# Patient Record
Sex: Female | Born: 1965 | Race: Asian | Hispanic: No | Marital: Married | Smoking: Never smoker
Health system: Southern US, Community
[De-identification: ages and names within clinical notes are randomized; demographics above are authoritative.]

## PROBLEM LIST (undated history)

## (undated) HISTORY — PX: NECK SURGERY: SHX720

---

## 2014-10-04 ENCOUNTER — Other Ambulatory Visit: Payer: Self-pay | Admitting: Internal Medicine

## 2014-10-04 DIAGNOSIS — Z1231 Encounter for screening mammogram for malignant neoplasm of breast: Secondary | ICD-10-CM

## 2014-10-13 ENCOUNTER — Ambulatory Visit
Admission: RE | Admit: 2014-10-13 | Discharge: 2014-10-13 | Disposition: A | Discharge status: Home / Self Care | Payer: BLUE CROSS/BLUE SHIELD | Source: Ambulatory Visit | Attending: Internal Medicine | Admitting: Internal Medicine

## 2014-10-13 DIAGNOSIS — Z1231 Encounter for screening mammogram for malignant neoplasm of breast: Secondary | ICD-10-CM

## 2017-07-31 ENCOUNTER — Other Ambulatory Visit: Payer: Self-pay | Admitting: Internal Medicine

## 2017-07-31 DIAGNOSIS — Z1231 Encounter for screening mammogram for malignant neoplasm of breast: Secondary | ICD-10-CM

## 2017-08-28 ENCOUNTER — Ambulatory Visit
Admission: RE | Admit: 2017-08-28 | Discharge: 2017-08-28 | Disposition: A | Discharge status: Home / Self Care | Payer: PRIVATE HEALTH INSURANCE | Source: Ambulatory Visit | Attending: Internal Medicine | Admitting: Internal Medicine

## 2017-08-28 DIAGNOSIS — Z1231 Encounter for screening mammogram for malignant neoplasm of breast: Secondary | ICD-10-CM

## 2017-08-29 ENCOUNTER — Other Ambulatory Visit: Payer: Self-pay | Admitting: Internal Medicine

## 2017-08-29 DIAGNOSIS — R928 Other abnormal and inconclusive findings on diagnostic imaging of breast: Secondary | ICD-10-CM

## 2017-11-11 ENCOUNTER — Ambulatory Visit
Admission: RE | Admit: 2017-11-11 | Discharge: 2017-11-11 | Disposition: A | Discharge status: Home / Self Care | Payer: BLUE CROSS/BLUE SHIELD | Source: Ambulatory Visit | Attending: Internal Medicine | Admitting: Internal Medicine

## 2017-11-11 ENCOUNTER — Ambulatory Visit
Admission: RE | Admit: 2017-11-11 | Discharge: 2017-11-11 | Disposition: A | Discharge status: Home / Self Care | Payer: PRIVATE HEALTH INSURANCE | Source: Ambulatory Visit | Attending: Internal Medicine | Admitting: Internal Medicine

## 2017-11-11 ENCOUNTER — Other Ambulatory Visit: Payer: Self-pay | Admitting: Internal Medicine

## 2017-11-11 DIAGNOSIS — R928 Other abnormal and inconclusive findings on diagnostic imaging of breast: Secondary | ICD-10-CM

## 2017-11-11 DIAGNOSIS — N6320 Unspecified lump in the left breast, unspecified quadrant: Secondary | ICD-10-CM

## 2018-05-13 ENCOUNTER — Inpatient Hospital Stay
Admission: RE | Admit: 2018-05-13 | Discharge: 2018-05-13 | Disposition: A | Discharge status: Home / Self Care | Payer: BLUE CROSS/BLUE SHIELD | Source: Ambulatory Visit | Attending: Internal Medicine | Admitting: Internal Medicine

## 2018-10-26 ENCOUNTER — Other Ambulatory Visit: Payer: Self-pay | Admitting: Internal Medicine

## 2018-10-26 DIAGNOSIS — N6320 Unspecified lump in the left breast, unspecified quadrant: Secondary | ICD-10-CM

## 2018-11-09 ENCOUNTER — Other Ambulatory Visit: Payer: Self-pay | Admitting: Internal Medicine

## 2018-11-13 ENCOUNTER — Ambulatory Visit
Admission: RE | Admit: 2018-11-13 | Discharge: 2018-11-13 | Disposition: A | Discharge status: Home / Self Care | Payer: BLUE CROSS/BLUE SHIELD | Source: Ambulatory Visit | Attending: Internal Medicine | Admitting: Internal Medicine

## 2018-11-13 ENCOUNTER — Other Ambulatory Visit: Payer: Self-pay

## 2018-11-13 DIAGNOSIS — N6320 Unspecified lump in the left breast, unspecified quadrant: Secondary | ICD-10-CM

## 2019-05-22 ENCOUNTER — Ambulatory Visit: Payer: BLUE CROSS/BLUE SHIELD | Attending: Internal Medicine

## 2019-05-22 DIAGNOSIS — Z23 Encounter for immunization: Secondary | ICD-10-CM

## 2019-05-22 NOTE — Progress Notes (Signed)
   Covid-19 Vaccination Clinic  Name:  Valerie Campbell    MRN: [id number] DOB: [birth year]  05/22/2019  [name] was observed post Covid-19 immunization for 15 minutes without incident. She was provided with Vaccine Information Sheet and instruction to access the V-Safe system.   [name] was instructed to call 911 with any severe reactions post vaccine: Marland Kitchen Difficulty breathing  . Swelling of face and throat  . A fast heartbeat  . A bad rash all over body  . Dizziness and weakness   Immunizations Administered    Name Date Dose VIS Date Route   Pfizer COVID-19 Vaccine 05/22/2019 12:53 PM 0.3 mL 02/19/2019 Intramuscular   Manufacturer: [employer], [employer]   Lot: [id number]   NDC: [address]-5

## 2019-06-15 ENCOUNTER — Ambulatory Visit: Payer: BLUE CROSS/BLUE SHIELD | Attending: Internal Medicine

## 2019-06-15 DIAGNOSIS — Z23 Encounter for immunization: Secondary | ICD-10-CM

## 2019-06-15 NOTE — Progress Notes (Signed)
   Covid-19 Vaccination Clinic  Name:  Valerie Campbell    MRN: [id number] DOB: Jul 20, 1965  06/15/2019  [name] was observed post Covid-19 immunization for 15 minutes without incident. She was provided with Vaccine Information Sheet and instruction to access the V-Safe system.   [name] was instructed to call 911 with any severe reactions post vaccine: Marland Kitchen Difficulty breathing  . Swelling of face and throat  . A fast heartbeat  . A bad rash all over body  . Dizziness and weakness   Immunizations Administered    Name Date Dose VIS Date Route   Pfizer COVID-19 Vaccine 06/15/2019 12:50 PM 0.3 mL 02/19/2019 Intramuscular   Manufacturer: [employer], [employer]   Lot: [id number]   NDC: [address]-7

## 2019-09-16 IMAGING — MG DIGITAL DIAGNOSTIC UNILATERAL LEFT MAMMOGRAM WITH TOMO AND CAD
6 series · 6 of 18 positions shown · non-contrast
Comparison: Previous exam(s).

CLINICAL DATA: Screening recall for asymmetry seen in the left
breast on the MLO view only.

EXAM:
DIGITAL DIAGNOSTIC UNILATERAL LEFT MAMMOGRAM WITH CAD AND TOMO
LEFT BREAST ULTRASOUND

[L MLO synth-2D (1 of 2)]
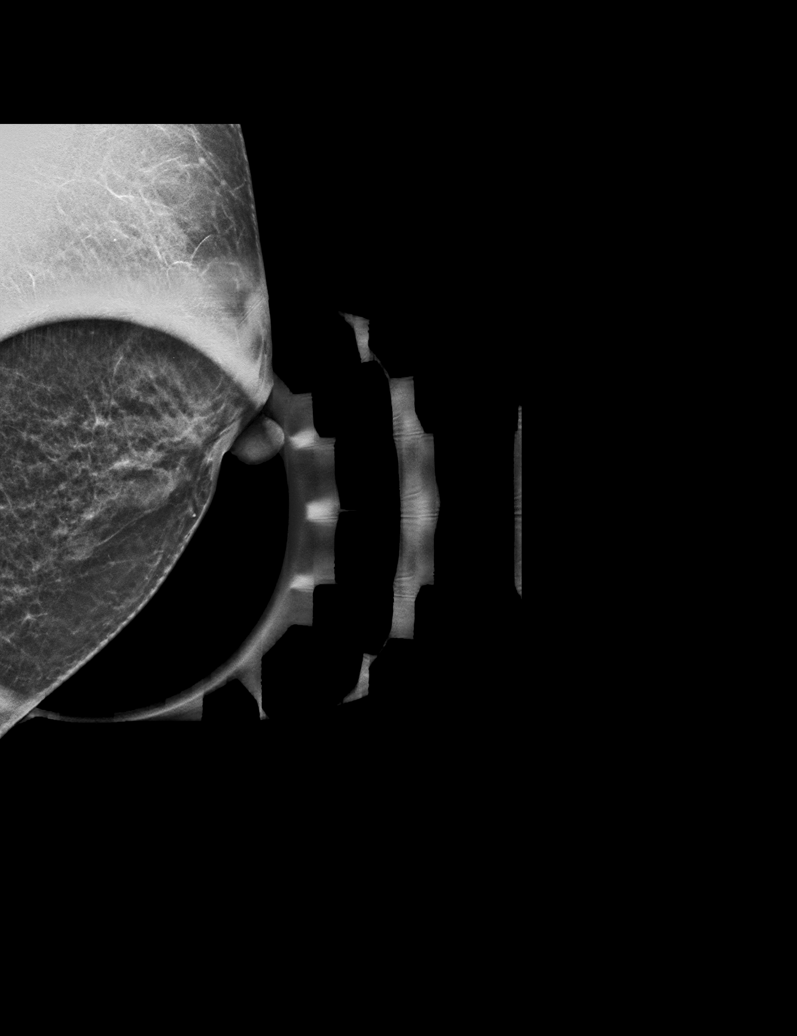

[L MLO synth-2D (2 of 2)]
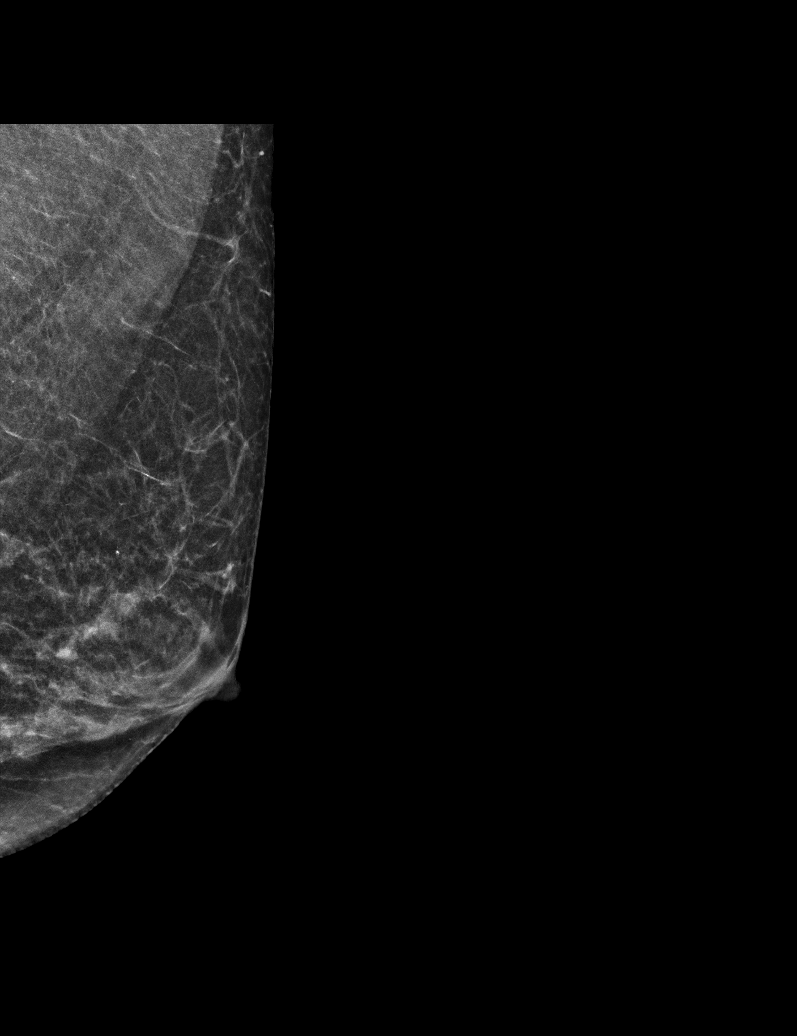

[L CC synth-2D]
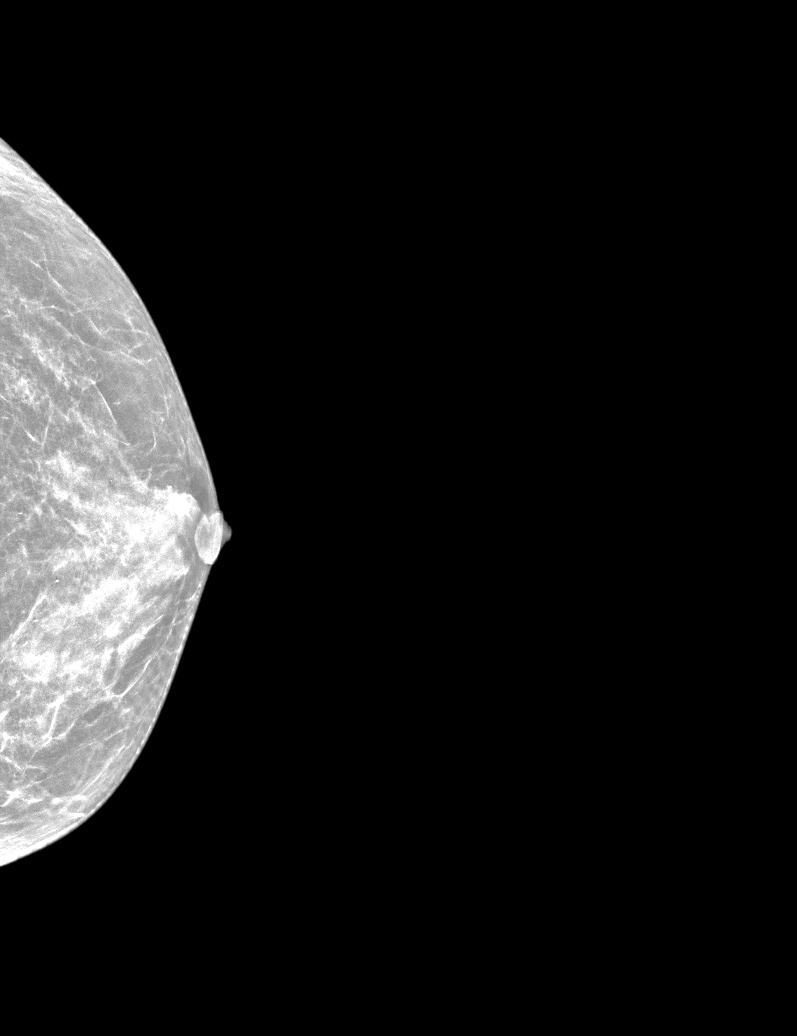

[L CC tomo · tomo slice 23/44.0]
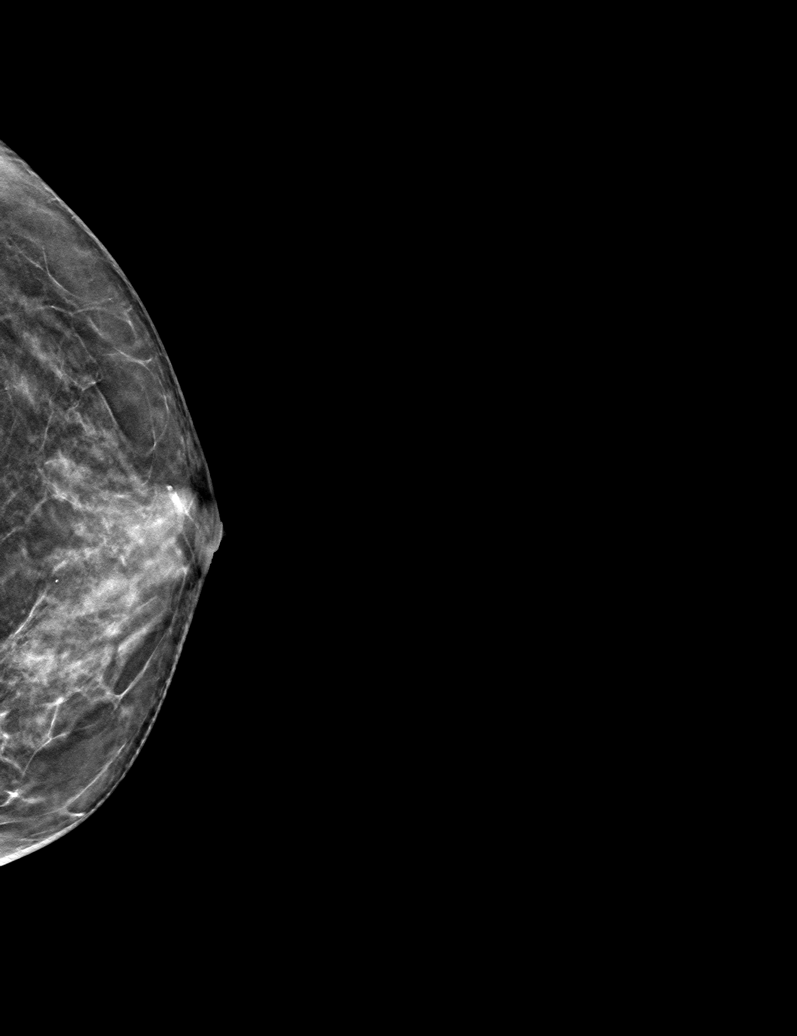

[L MLO tomo (1 of 2) · tomo slice 17/32.0]
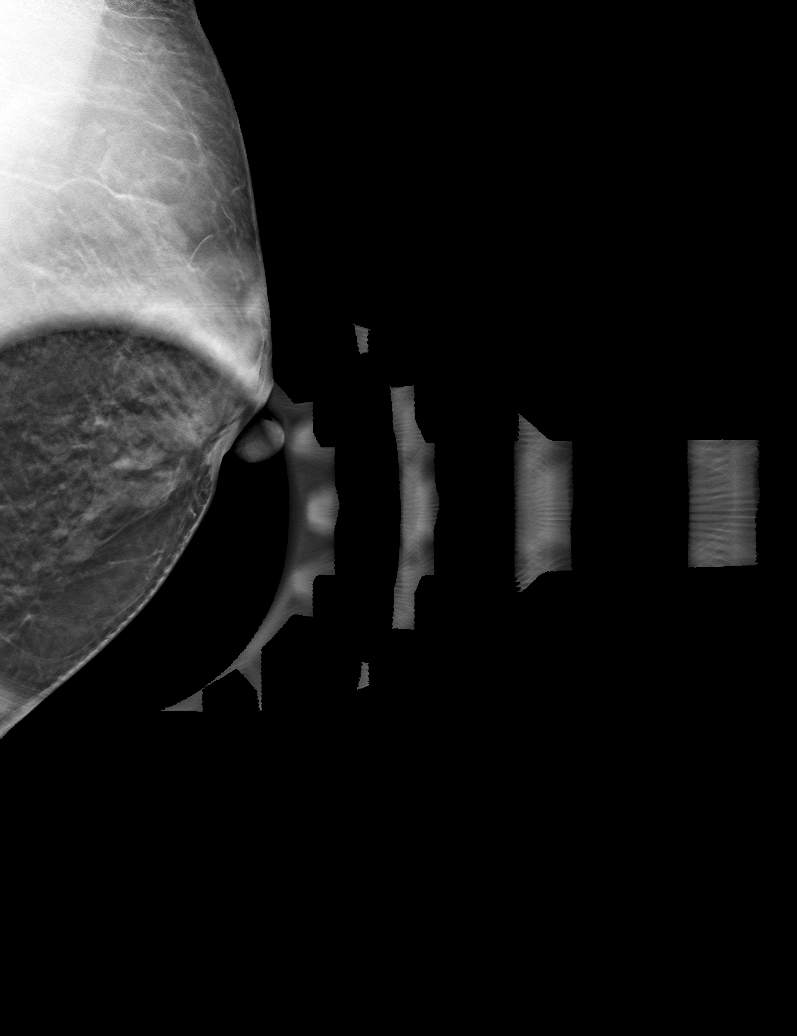

[L MLO tomo (2 of 2) · tomo slice 22/43.0]
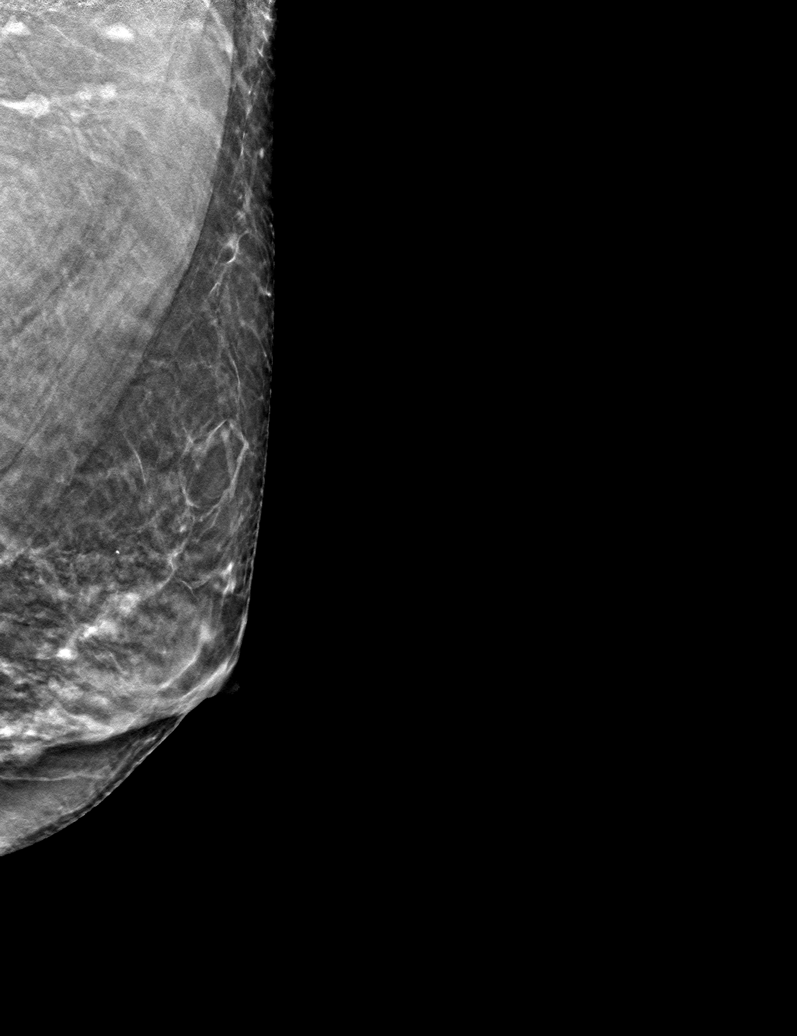

[6 of 18 positions shown; findings below may reference images not displayed]

ACR Breast Density Category d: The breast tissue is extremely dense,
which lowers the sensitivity of mammography.
FINDINGS: Cc and MLO tomograms were performed of the left breast. There is an
oval mass in the lower inner left breast with obscured margins
measuring approximately 6-7 mm.

Mammographic images were processed with CAD.

Targeted ultrasound of the inner left breast was performed
demonstrating an oval circumscribed predominantly hypoechoic mass
with an internal echogenic component at 8 o'clock 2 cm from nipple
measuring 0.6 x 0.3 x 0.5 cm. This demonstrates imaging features
suggestive of a small fibroadenoma or intramammary lymph node in
corresponds well with the mass seen in the inner left breast at
mammography.
IMPRESSION: Probably benign left breast mass.

RECOMMENDATION:
Left breast ultrasound in 6 months.

I have discussed the findings and recommendations with the patient.
Results were also provided in writing at the conclusion of the
visit. If applicable, a reminder letter will be sent to the patient
regarding the next appointment.

BI-RADS CATEGORY  3: Probably benign.

## 2022-03-02 ENCOUNTER — Ambulatory Visit
Admission: RE | Admit: 2022-03-02 | Discharge: 2022-03-02 | Disposition: A | Discharge status: Home / Self Care | Payer: 59 | Source: Ambulatory Visit | Attending: Family Medicine | Admitting: Family Medicine

## 2022-03-02 VITALS — BP 94/59 | HR 64 | Temp 98.3°F | Resp 17

## 2022-03-02 DIAGNOSIS — J069 Acute upper respiratory infection, unspecified: Secondary | ICD-10-CM

## 2022-03-02 DIAGNOSIS — J209 Acute bronchitis, unspecified: Secondary | ICD-10-CM | POA: Diagnosis not present

## 2022-03-02 MED ORDER — PROMETHAZINE-DM 6.25-15 MG/5ML PO SYRP: 5.0000 mL | ORAL_SOLUTION | Freq: Four times a day (QID) | ORAL | 0 refills | Status: AC | PRN

## 2022-03-02 MED ORDER — AZITHROMYCIN 250 MG PO TABS: 250.0000 mg | ORAL_TABLET | Freq: Every day | ORAL | 0 refills | Status: AC

## 2022-03-02 NOTE — ED Provider Notes (Signed)
[facility]    CSN: [id number] Arrival date & time: 03/02/22  1154      History   Chief Complaint Chief Complaint  Patient presents with   Sore Throat    APPT 12pm, X 2 weeks   Cough    HPI Valerie Campbell is a 56 y.o. female.   HPI [name] woman.  Here with her daughter who is a Psychologist, occupational.  The daughter provides interpretation.   Has had a cough for 2 weeks.  Sore throat for 2 weeks.  More tired.  Appetite is diminished.  No fever or chills.  No headache or body ache.  No known exposure to COVID or influenza.  No COVID testing at home. History reviewed. No pertinent past medical history.  There are no problems to display for this patient.   Past Surgical History:  Procedure Laterality Date   NECK SURGERY      OB History   No obstetric history on file.      Home Medications    Prior to Admission medications   Medication Sig Start Date End Date Taking? Authorizing Provider  azithromycin (ZITHROMAX) 250 MG tablet Take 1 tablet (250 mg total) by mouth daily. Take first 2 tablets together, then 1 every day until finished. 03/02/22  Yes Eustace Moore, MD  promethazine-dextromethorphan (PROMETHAZINE-DM) 6.25-15 MG/5ML syrup Take 5 mLs by mouth 4 (four) times daily as needed for cough. 03/02/22  Yes Eustace Moore, MD    Family History Family History  Problem Relation Age of Onset   Breast cancer Neg Hx     Social History Social History   Tobacco Use   Smoking status: Never   Smokeless tobacco: Never  Vaping Use   Vaping Use: Never used  Substance Use Topics   Alcohol use: Not Currently     Allergies   Patient has no known allergies.   Review of Systems Review of Systems See HPI  Physical Exam Triage Vital Signs ED Triage Vitals [03/02/22 1250]  Enc Vitals Group     BP (!) 94/59     Pulse Rate 64     Resp 17     Temp 98.3 F (36.8 C)     Temp Source Oral     SpO2 96 %     Weight      Height       Head Circumference      Peak Flow      Pain Score 0     Pain Loc      Pain Edu?      Excl. in GC?    No data found.  Updated Vital Signs BP (!) 94/59 (BP Location: Left Arm)   Pulse 64   Temp 98.3 F (36.8 C) (Oral)   Resp 17   SpO2 96%      Physical Exam Constitutional:      General: She is not in acute distress.    Appearance: She is well-developed.  HENT:     Head: Normocephalic and atraumatic.     Right Ear: Tympanic membrane and ear canal normal.     Left Ear: Tympanic membrane and ear canal normal.     Nose: Nose normal. No congestion.     Mouth/Throat:     Pharynx: No posterior oropharyngeal erythema.  Eyes:     Conjunctiva/sclera: Conjunctivae normal.     Pupils: Pupils are equal, round, and reactive to light.  Cardiovascular:     Rate and  Rhythm: Normal rate and regular rhythm.     Heart sounds: Normal heart sounds.  Pulmonary:     Effort: Pulmonary effort is normal. No respiratory distress.     Breath sounds: Normal breath sounds.  Abdominal:     General: There is no distension.     Palpations: Abdomen is soft.  Musculoskeletal:        General: Normal range of motion.     Cervical back: Normal range of motion.  Skin:    General: Skin is warm and dry.  Neurological:     Mental Status: She is alert.      UC Treatments / Results  Labs (all labs ordered are listed, but only abnormal results are displayed) Labs Reviewed - No data to display  EKG   Radiology No results found.  Procedures Procedures (including critical care time)  Medications Ordered in UC Medications - No data to display  Initial Impression / Assessment and Plan / UC Course  I have reviewed the triage vital signs and the nursing notes.  Pertinent labs & imaging results that were available during my care of the patient were reviewed by me and considered in my medical decision making (see chart for details).     Because the symptoms have been going on for 2 weeks and is  worsening.  Patient has productive cough.  Will treat with antibiotics Final Clinical Impressions(s) / UC Diagnoses   Final diagnoses:  Upper respiratory tract infection, unspecified type  Acute bronchitis, unspecified organism     Discharge Instructions      Take azithromycin as directed.  2 pills today and then 1 a day until gone May continue to use DayQuil if it helps symptoms I have prescribed Phenergan DM for cough.  It is a bit stronger for coughing spells.  May cause drowsiness Drink lots of fluids See your doctor if not improving by next week   ED Prescriptions     Medication Sig Dispense Auth. Provider   promethazine-dextromethorphan (PROMETHAZINE-DM) 6.25-15 MG/5ML syrup Take 5 mLs by mouth 4 (four) times daily as needed for cough. 118 mL Eustace Moore, MD   azithromycin (ZITHROMAX) 250 MG tablet Take 1 tablet (250 mg total) by mouth daily. Take first 2 tablets together, then 1 every day until finished. 6 tablet Eustace Moore, MD      PDMP not reviewed this encounter.   Eustace Moore, MD 03/02/22 1535

## 2022-03-02 NOTE — Discharge Instructions (Signed)
Take azithromycin as directed.  2 pills today and then 1 a day until gone May continue to use DayQuil if it helps symptoms I have prescribed Phenergan DM for cough.  It is a bit stronger for coughing spells.  May cause drowsiness Drink lots of fluids See your doctor if not improving by next week

## 2022-03-02 NOTE — ED Triage Notes (Signed)
Pt here today with daughter with daughter who says she's been c/o cough and sore throat x 2 weeks. Denies fever. Taking Dayquil and nyquil prn.
